# Patient Record
Sex: Female | Born: 1990 | Race: Black or African American | Hispanic: No | Smoking: Current every day smoker
Health system: Southern US, Community
[De-identification: ages and names within clinical notes are randomized; demographics above are authoritative.]

---
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
## 2010-02-09 ENCOUNTER — Emergency Department (HOSPITAL_COMMUNITY): Admission: EM | Admit: 2010-02-09 | Discharge: 2010-02-09 | Payer: Self-pay | Admitting: Emergency Medicine

## 2010-02-21 ENCOUNTER — Emergency Department (HOSPITAL_COMMUNITY): Admission: EM | Admit: 2010-02-21 | Discharge: 2010-02-21 | Payer: Self-pay | Admitting: Emergency Medicine

## 2010-02-22 ENCOUNTER — Emergency Department (HOSPITAL_COMMUNITY): Admission: EM | Admit: 2010-02-22 | Discharge: 2010-02-22 | Payer: Self-pay | Admitting: Emergency Medicine

## 2010-05-20 ENCOUNTER — Emergency Department (HOSPITAL_COMMUNITY): Admission: EM | Admit: 2010-05-20 | Discharge: 2010-05-20 | Payer: Self-pay | Admitting: Family Medicine

## 2011-04-26 LAB — POCT URINALYSIS DIP (DEVICE)
Bilirubin Urine: NEGATIVE
Glucose, UA: NEGATIVE
Hgb urine dipstick: NEGATIVE
Ketones, ur: NEGATIVE
Nitrite: NEGATIVE
Operator id: 270961
Protein, ur: NEGATIVE
Specific Gravity, Urine: 1.02
Urobilinogen, UA: 0.2
pH: 8.5 — ABNORMAL HIGH

## 2011-04-26 LAB — WET PREP, GENITAL
Trich, Wet Prep: NONE SEEN
Yeast Wet Prep HPF POC: NONE SEEN

## 2011-04-26 LAB — GC/CHLAMYDIA PROBE AMP, GENITAL
Chlamydia, DNA Probe: NEGATIVE
GC Probe Amp, Genital: NEGATIVE

## 2011-04-26 LAB — POCT PREGNANCY, URINE
Operator id: 270961
Preg Test, Ur: NEGATIVE

## 2011-04-27 LAB — URINALYSIS, ROUTINE W REFLEX MICROSCOPIC
Bilirubin Urine: NEGATIVE
Glucose, UA: NEGATIVE
Hgb urine dipstick: NEGATIVE
Ketones, ur: NEGATIVE
Nitrite: NEGATIVE
Protein, ur: NEGATIVE
Specific Gravity, Urine: 1.029
Urobilinogen, UA: 1
pH: 7

## 2011-04-27 LAB — WET PREP, GENITAL
Clue Cells Wet Prep HPF POC: NONE SEEN
Trich, Wet Prep: NONE SEEN

## 2011-04-27 LAB — GC/CHLAMYDIA PROBE AMP, GENITAL
Chlamydia, DNA Probe: NEGATIVE
GC Probe Amp, Genital: NEGATIVE

## 2011-04-27 LAB — PREGNANCY, URINE: Preg Test, Ur: NEGATIVE

## 2011-04-27 LAB — URINE MICROSCOPIC-ADD ON

## 2011-05-14 LAB — RPR: RPR Ser Ql: NONREACTIVE

## 2011-05-14 LAB — GC/CHLAMYDIA PROBE AMP, GENITAL
Chlamydia, DNA Probe: NEGATIVE
GC Probe Amp, Genital: NEGATIVE

## 2011-05-22 LAB — RPR: RPR Ser Ql: NONREACTIVE

## 2011-05-22 LAB — WET PREP, GENITAL
Trich, Wet Prep: NONE SEEN
WBC, Wet Prep HPF POC: NONE SEEN

## 2011-05-22 LAB — URINALYSIS, ROUTINE W REFLEX MICROSCOPIC
Bilirubin Urine: NEGATIVE
Glucose, UA: NEGATIVE
Hgb urine dipstick: NEGATIVE
Ketones, ur: NEGATIVE
Nitrite: NEGATIVE
Protein, ur: NEGATIVE
Specific Gravity, Urine: 1.019
Urobilinogen, UA: 1
pH: 7

## 2011-05-22 LAB — POCT PREGNANCY, URINE
Operator id: 25982
Preg Test, Ur: NEGATIVE

## 2011-05-22 LAB — URINE MICROSCOPIC-ADD ON

## 2011-05-22 LAB — GC/CHLAMYDIA PROBE AMP, GENITAL
Chlamydia, DNA Probe: NEGATIVE
GC Probe Amp, Genital: NEGATIVE

## 2011-05-23 LAB — CULTURE, ROUTINE-ABSCESS

## 2011-07-20 ENCOUNTER — Emergency Department (INDEPENDENT_AMBULATORY_CARE_PROVIDER_SITE_OTHER)
Admission: EM | Admit: 2011-07-20 | Discharge: 2011-07-20 | Disposition: A | Discharge status: Home / Self Care | Payer: Self-pay | Source: Home / Self Care | Attending: Emergency Medicine | Admitting: Emergency Medicine

## 2011-07-20 ENCOUNTER — Encounter: Payer: Self-pay | Admitting: Emergency Medicine

## 2011-07-20 DIAGNOSIS — T7840XA Allergy, unspecified, initial encounter: Secondary | ICD-10-CM

## 2011-07-20 MED ORDER — FEXOFENADINE HCL 60 MG PO TABS: 60.0000 mg | ORAL_TABLET | Freq: Two times a day (BID) | ORAL | Status: DC

## 2011-07-20 MED ORDER — HYDROCORTISONE 1 % EX CREA: TOPICAL_CREAM | CUTANEOUS | Status: DC

## 2011-07-20 MED ORDER — PREDNISONE 50 MG PO TABS: ORAL_TABLET | ORAL | Status: AC

## 2011-07-20 NOTE — ED Provider Notes (Signed)
History     CSN: [id number] Arrival date & time: 07/20/2011  2:40 PM   First MD Initiated Contact with Patient 07/20/11 1438      Chief Complaint  Patient presents with  . Urticaria    (Consider location/radiation/quality/duration/timing/severity/associated sxs/prior treatment) HPI Comments: Pt with progressively worsening  itchy "whelps" on RUE starting yesterday. Today woke up with red, itchy, swollen right earlobe, and similar wheal at hairline at occiput. Is itchy all day long. No sensation of being bitten at night, no blood on bedclothes in am. No new lotions, soaps, detergents, medications. No other contacts with similar rash. No pets in the home. No exposure to poison ivy. Tried warm compress which made sx worse. threw mattress and boxspring out this morning as was concerned about bedbugs.   Patient is a 20 y.o. female presenting with urticaria. The history is provided by the patient.  Urticaria This is a new problem. The current episode started 2 days ago. The problem has been gradually worsening. Pertinent negatives include no shortness of breath. She has tried a warm compress for the symptoms. The treatment provided no relief.    History reviewed. No pertinent past medical history.  History reviewed. No pertinent past surgical history.  History reviewed. No pertinent family history.  History  Substance Use Topics  . Smoking status: Current Some Day Smoker  . Smokeless tobacco: Not on file  . Alcohol Use: No    OB History    Grav Para Term Preterm Abortions TAB SAB Ect Mult Living                  Review of Systems  Constitutional: Negative for fever.  HENT: Negative for trouble swallowing and voice change.   Respiratory: Negative for shortness of breath.   Gastrointestinal: Negative for nausea and vomiting.  Skin: Positive for rash.    Allergies  Review of patient's allergies indicates no known allergies.  Home Medications   Current Outpatient Rx    Name Route Sig Dispense Refill  . FEXOFENADINE HCL 60 MG PO TABS Oral Take 1 tablet (60 mg total) by mouth 2 (two) times daily. 20 tablet 0  . HYDROCORTISONE 1 % EX CREA  Apply to affected area 2 times daily 15 g 0  . PREDNISONE 50 MG PO TABS  1 tablet po daily x 2 days, then 1/2 tablet once daily for 2 days 5 tablet 0    BP 108/71  Pulse 88  Temp(Src) 98.4 F (36.9 C) (Oral)  Resp 16  SpO2 100%  LMP 07/16/2011  Physical Exam  Nursing note and vitals reviewed. Constitutional: She is oriented to person, place, and time. She appears well-developed and well-nourished. No distress.  HENT:  Head: Normocephalic and atraumatic.  Eyes: Conjunctivae and EOM are normal. Pupils are equal, round, and reactive to light.  Neck: Normal range of motion.  Cardiovascular: Regular rhythm.   Pulmonary/Chest: Effort normal and breath sounds normal.  Abdominal: She exhibits no distension.  Musculoskeletal: Normal range of motion.  Neurological: She is alert and oriented to person, place, and time.  Skin: Skin is warm and dry.       Urticarial wheal on right elbow with surrounding erythema, excoriations. Swollen right earlobe, urticarial wheal at hairline. No rash anywhere else. No burrows between fingers.   Psychiatric: She has a normal mood and affect. Her behavior is normal. Judgment and thought content normal.    ED Course  Procedures (including critical care time)  Labs Reviewed - No  data to display No results found.   1. Allergic reaction       MDM  Appears to be localised allergic reaction to bug bites. Does not appear to be scabies.   Luiz Blare, MD 07/20/11 1550

## 2011-07-20 NOTE — ED Notes (Signed)
Pt having itchy painful bumps that are showing up starting yesterday. They have gotten worse and more numerous. Has one on Right arm, right ear, and back of neck.

## 2011-10-14 ENCOUNTER — Emergency Department (HOSPITAL_COMMUNITY)
Admission: EM | Admit: 2011-10-14 | Discharge: 2011-10-14 | Disposition: A | Discharge status: Home / Self Care | Payer: Self-pay | Attending: Emergency Medicine | Admitting: Emergency Medicine

## 2011-10-14 ENCOUNTER — Encounter (HOSPITAL_COMMUNITY): Payer: Self-pay | Admitting: *Deleted

## 2011-10-14 DIAGNOSIS — L0231 Cutaneous abscess of buttock: Secondary | ICD-10-CM | POA: Insufficient documentation

## 2011-10-14 DIAGNOSIS — F17200 Nicotine dependence, unspecified, uncomplicated: Secondary | ICD-10-CM | POA: Insufficient documentation

## 2011-10-14 DIAGNOSIS — L03317 Cellulitis of buttock: Secondary | ICD-10-CM | POA: Insufficient documentation

## 2011-10-14 MED ORDER — LIDOCAINE-EPINEPHRINE 2 %-1:100000 IJ SOLN
20.0000 mL | Freq: Once | INTRAMUSCULAR | Status: AC
  Administered 2011-10-14: 2 mL via INTRADERMAL

## 2011-10-14 MED ORDER — HYDROCODONE-ACETAMINOPHEN 5-325 MG PO TABS: 1.0000 | ORAL_TABLET | Freq: Four times a day (QID) | ORAL | Status: AC | PRN

## 2011-10-14 NOTE — ED Notes (Signed)
Pt from home with reports of sore area to top, right buttock, pt sought tx at a STD clinic on Friday but was told that area should drain on its own "but I messed with it" with increase in pain and drainage.

## 2011-10-14 NOTE — Discharge Instructions (Signed)
Return here in 2 days for recheck.  Keep the area covered.  Use heat around the area as well.

## 2011-10-14 NOTE — ED Provider Notes (Signed)
History     CSN: [id number]  Arrival date & time 10/14/11  1202   First MD Initiated Contact with Patient 10/14/11 1243      Chief Complaint  Patient presents with  . Abscess    right upper buttock    (Consider location/radiation/quality/duration/timing/severity/associated sxs/prior treatment) HPI A service emergency department with an abscess to her right upper buttocks that has been present for the last 2 days.  She states that she was at the STD clinic and asked them to evaluated and they said to drain on its own.  Patient denies fevers, shortness breath, weakness, or nausea/vomiting. History reviewed. No pertinent past medical history.  History reviewed. No pertinent past surgical history.  History reviewed. No pertinent family history.  History  Substance Use Topics  . Smoking status: Current Everyday Smoker -- 0.5 packs/day    Types: Cigarettes  . Smokeless tobacco: Never Used  . Alcohol Use: No    OB History    Grav Para Term Preterm Abortions TAB SAB Ect Mult Living                  Review of Systems All pertinent positives and negatives reviewed in the history of present illness  Allergies  Review of patient's allergies indicates no known allergies.  Home Medications   Current Outpatient Rx  Name Route Sig Dispense Refill  . NAPROXEN SODIUM 220 MG PO TABS Oral Take 440 mg by mouth 2 (two) times daily as needed. For pain.      BP 125/79  Pulse 115  Temp(Src) 99 F (37.2 C) (Oral)  Resp 16  Ht 5\' 4"  (1.626 m)  Wt 107 lb (48.535 kg)  BMI 18.37 kg/m2  SpO2 99%  LMP 10/03/2011  Physical Exam  Constitutional: She is oriented to person, place, and time. She appears well-developed and well-nourished. No distress.  Eyes: Pupils are equal, round, and reactive to light.  Cardiovascular: Normal rate, regular rhythm and normal heart sounds.  Exam reveals no gallop and no friction rub.   No murmur heard. Pulmonary/Chest: Effort normal and breath sounds  normal. No respiratory distress.  Neurological: She is alert and oriented to person, place, and time. Coordination normal.  Skin:       Patient has an abscess noted to the right upper buttocks more mediallylocated.    ED Course  Procedures (including critical care time)   INCISION AND DRAINAGE Performed by: Carlyle Dolly Consent: Verbal consent obtained. Risks and benefits: risks, benefits and alternatives were discussed Type: abscess  Body area: R upper buttock medially  Anesthesia: local infiltration  Local anesthetic: lidocaine2%  With Epi  Anesthetic total: 6 ml  Complexity: complex Blunt dissection to break up loculations  Drainage: purulent  Drainage amount: large  Packing material: 1/4 in iodoform gauze  Patient tolerance: Patient tolerated the procedure well with no immediate complications.    Patient is told to return to the emergency department in 2 days for recheck.  She is advised to keep area covered.  She is also asked to return here for any worsening in her condition in the interim.  There is no surrounding cellulitis the area.      MDM         Carlyle Dolly, PA-C 10/14/11 1424  Carlyle Dolly, PA-C 10/14/11 1427

## 2011-10-14 NOTE — ED Provider Notes (Signed)
Medical screening examination/treatment/procedure(s) were performed by non-physician practitioner and as supervising physician I was immediately available for consultation/collaboration.   Glynn Octave, MD 10/14/11 [phone]

## 2011-10-16 ENCOUNTER — Emergency Department (HOSPITAL_COMMUNITY)
Admission: EM | Admit: 2011-10-16 | Discharge: 2011-10-16 | Disposition: A | Discharge status: Home / Self Care | Payer: Self-pay | Attending: Emergency Medicine | Admitting: Emergency Medicine

## 2011-10-16 ENCOUNTER — Encounter (HOSPITAL_COMMUNITY): Payer: Self-pay | Admitting: Emergency Medicine

## 2011-10-16 DIAGNOSIS — Z4800 Encounter for change or removal of nonsurgical wound dressing: Secondary | ICD-10-CM | POA: Insufficient documentation

## 2011-10-16 DIAGNOSIS — L03317 Cellulitis of buttock: Secondary | ICD-10-CM | POA: Insufficient documentation

## 2011-10-16 DIAGNOSIS — L0231 Cutaneous abscess of buttock: Secondary | ICD-10-CM | POA: Insufficient documentation

## 2011-10-16 DIAGNOSIS — Z5189 Encounter for other specified aftercare: Secondary | ICD-10-CM

## 2011-10-16 DIAGNOSIS — F17200 Nicotine dependence, unspecified, uncomplicated: Secondary | ICD-10-CM | POA: Insufficient documentation

## 2011-10-16 NOTE — ED Notes (Signed)
Pt d/c to home by MD

## 2011-10-16 NOTE — ED Provider Notes (Signed)
History     CSN: [id number]  Arrival date & time 10/16/11  4098   First MD Initiated Contact with Patient 10/16/11 [phone]      No chief complaint on file.   (Consider location/radiation/quality/duration/timing/severity/associated sxs/prior treatment) The history is provided by the patient and medical records.   patient reports incision and drainage of her right buttock abscess 2 days ago.  She reports no fever or chills.  She reports it feels much better.  She has no other complaints.  She is here today requesting wound check and packing removal.  History reviewed. No pertinent past medical history.  History reviewed. No pertinent past surgical history.  History reviewed. No pertinent family history.  History  Substance Use Topics  . Smoking status: Current Everyday Smoker -- 0.5 packs/day    Types: Cigarettes  . Smokeless tobacco: Never Used  . Alcohol Use: No    OB History    Grav Para Term Preterm Abortions TAB SAB Ect Mult Living                  Review of Systems  All other systems reviewed and are negative.    Allergies  Review of patient's allergies indicates no known allergies.  Home Medications   Current Outpatient Rx  Name Route Sig Dispense Refill  . HYDROCODONE-ACETAMINOPHEN 5-325 MG PO TABS Oral Take 1 tablet by mouth every 6 (six) hours as needed for pain. 15 tablet 0  . NAPROXEN SODIUM 220 MG PO TABS Oral Take 440 mg by mouth 2 (two) times daily as needed. For pain.      BP 115/70  Pulse 86  Temp(Src) 98.7 F (37.1 C) (Oral)  Resp 18  SpO2 100%  LMP 10/03/2011  Physical Exam  Nursing note and vitals reviewed. Constitutional: She appears well-developed and well-nourished. No distress.  HENT:  Head: Normocephalic and atraumatic.  Eyes: EOM are normal.  Neck: Normal range of motion.  Cardiovascular: Normal rate and regular rhythm.   Pulmonary/Chest: Effort normal.  Abdominal: Soft.  Musculoskeletal: Normal range of motion.       Right  buttock with healing abscess.  Packing in place.  No surrounding erythema  Neurological: She is alert.  Skin: Skin is warm and dry.  Psychiatric: She has a normal mood and affect.    ED Course  Procedures (including critical care time)  Labs Reviewed - No data to display No results found.   1. Wound check, abscess       MDM  Packing removed.  Wound is healing well.  DC home        Lyanne Co, MD 10/16/11 [phone]

## 2011-10-16 NOTE — ED Notes (Signed)
Pt states she is here for a wound check  Pt had an I&D two days ago  Wound is on her buttock and still has packing in place

## 2012-10-12 ENCOUNTER — Encounter (HOSPITAL_COMMUNITY): Payer: Self-pay | Admitting: Family Medicine

## 2012-10-12 ENCOUNTER — Emergency Department (HOSPITAL_COMMUNITY)
Admission: EM | Admit: 2012-10-12 | Discharge: 2012-10-12 | Disposition: A | Discharge status: Home / Self Care | Payer: No Typology Code available for payment source | Attending: Emergency Medicine | Admitting: Emergency Medicine

## 2012-10-12 DIAGNOSIS — S0990XA Unspecified injury of head, initial encounter: Secondary | ICD-10-CM | POA: Insufficient documentation

## 2012-10-12 DIAGNOSIS — Y92410 Unspecified street and highway as the place of occurrence of the external cause: Secondary | ICD-10-CM | POA: Insufficient documentation

## 2012-10-12 DIAGNOSIS — Y9389 Activity, other specified: Secondary | ICD-10-CM | POA: Insufficient documentation

## 2012-10-12 DIAGNOSIS — F17200 Nicotine dependence, unspecified, uncomplicated: Secondary | ICD-10-CM | POA: Insufficient documentation

## 2012-10-12 DIAGNOSIS — S0993XA Unspecified injury of face, initial encounter: Secondary | ICD-10-CM | POA: Insufficient documentation

## 2012-10-12 MED ORDER — METHOCARBAMOL 500 MG PO TABS: 500.0000 mg | ORAL_TABLET | Freq: Two times a day (BID) | ORAL | Status: DC
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
## 2012-10-12 NOTE — ED Provider Notes (Signed)
Medical screening examination/treatment/procedure(s) were performed by non-physician practitioner and as supervising physician I was immediately available for consultation/collaboration.   Michael Y. Ghim, MD 10/12/12 1503 
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
## 2015-02-27 ENCOUNTER — Emergency Department (HOSPITAL_COMMUNITY)
Admission: EM | Admit: 2015-02-27 | Discharge: 2015-02-28 | Disposition: A | Discharge status: Home / Self Care | Payer: Self-pay | Attending: Emergency Medicine | Admitting: Emergency Medicine

## 2015-02-27 ENCOUNTER — Encounter (HOSPITAL_COMMUNITY): Payer: Self-pay

## 2015-02-27 DIAGNOSIS — L720 Epidermal cyst: Secondary | ICD-10-CM | POA: Insufficient documentation

## 2015-02-27 DIAGNOSIS — Z79899 Other long term (current) drug therapy: Secondary | ICD-10-CM | POA: Insufficient documentation

## 2015-02-27 DIAGNOSIS — Z720 Tobacco use: Secondary | ICD-10-CM | POA: Insufficient documentation

## 2015-02-27 DIAGNOSIS — Z880 Allergy status to penicillin: Secondary | ICD-10-CM | POA: Insufficient documentation

## 2015-02-27 DIAGNOSIS — L729 Follicular cyst of the skin and subcutaneous tissue, unspecified: Secondary | ICD-10-CM

## 2015-02-27 NOTE — ED Notes (Signed)
Pt presents with c/o knot on her left knee area. She reports that this knot has been there for years and that it is painful at times.

## 2015-02-28 NOTE — ED Notes (Signed)
Pt is sitting in chair barely able to keep her eyes open, speech is slightly slurred.  Pt reports that the knot on her knee has been there for over a year however began to hurt tonight.  Pt is laughing, talking to her family member and appears to be in NAD.

## 2015-02-28 NOTE — ED Notes (Signed)
Pt left prior to receiving d/c papers, e-signing or allowing VS to be taken.

## 2015-02-28 NOTE — Discharge Instructions (Signed)
Excision of Skin Lesions  Excision of a skin lesion refers to the removal of a section of skin by making small cuts (incisions) in the skin. This is typically done to remove a cancerous growth (basal cell carcinoma, squamous cell carcinoma, or melanoma) or a noncancerous growth (cyst). It may be done to treat or prevent cancer or infection. It may also be done to improve cosmetic appearance (removal of mole, skin tag).  LET YOUR CAREGIVER KNOW ABOUT:   · Allergies to food or medicine.  · Medicines taken, including vitamins, herbs, eyedrops, over-the-counter medicines, and creams.  · Use of steroids (by mouth or creams).  · Previous problems with anesthetics or numbing medicines.  · History of bleeding problems or blood clots.  · History of any prostheses.  · Previous surgery.  · Other health problems, including diabetes and kidney problems.  · Possibility of pregnancy, if this applies.  RISKS AND COMPLICATIONS   Many complications can be managed. With appropriate treatment and rehabilitation, the following complications are very uncommon:  · Bleeding.  · Infection.  · Scarring.  · Recurrence of cyst or cancer.  · Changes in skin sensation or appearance (discoloration, swelling).  · Reaction to anesthesia.  · Allergic reaction to surgical materials or ointments.  · Damage to nerves, blood vessels, muscles, or other structures.  · Continued pain.  BEFORE THE PROCEDURE   It is important to follow your caregiver's instructions prior to your procedure to avoid complications. Steps before your procedure may include:  · Physical exam, blood tests, other procedures, such as removing a small sample for examination under a microscope (biopsy).  · Your caregiver may review the procedure, the anesthesia being used, and what to expect after the procedure with you.  You may be asked to:  · Stop taking certain medicines, such as blood thinners (including aspirin, clopidogrel, ibuprofen), for several days prior to your  procedure.  · Take certain medicines.  · Stop smoking.  It is a good idea to arrange for a ride home after surgery and to have someone to help you with activities during recovery.  PROCEDURE   There are several excision techniques. The type of excision or surgical technique used will depend on your condition, the location of the lesion, and your overall health. After the lesion is sterilized and a local anesthetic is applied, the following may be performed:  Complete surgical excision  The area to be removed is marked with a pen. Using a small scalpel and scissors, the surgeon gently cuts around and under the lesion until it is completely removed. The lesion is placed in a special fluid and sent to the lab for examination. If necessary, bleeding will be controlled with a device that delivers heat. The edges of the wound are stitched together and a dressing is applied. This procedure may be performed to treat a cancerous growth or noncancerous cyst or lesion. Surgeons commonly perform an elliptical excision, to minimize scarring.  Excision of a cyst  The surgeon makes an incision on the cyst. The entire cyst is removed through the incision. The wound may be closed with a suture (stitch).  Shave excision  During shave excision, the surgeon uses a small blade or loop instrument to shave off the lesion. This may be done to remove a mole or skin tag. The wound is usually left to heal on its own without stitches.  Punch excision  During punch excision, the surgeon uses a small, round tool (like a cookie   cutter) to cut a circle shape out of the skin. The outer edges of the skin are stitched together. This may be done to remove a mole or scar or to perform a biopsy of the lesion.  Mohs micrographic surgery  During Mohs micrographic surgery, layers of the lesion are removed with a scalpel or loop instrument and immediately examined under a microscope until all of the abnormal or cancerous tissue is removed. This procedure is  minimally invasive and ensures the best cosmetic outcome, with removal of as little normal tissue as possible. Mohs is usually done to treat skin cancer, such as basal cell carcinoma or squamous cell carcinoma, particularly on the face and ears.  Antibiotic ointment is applied to the surgical area after each of the procedures listed above, as necessary.  AFTER THE PROCEDURE   How well you heal depends on many factors. Most patients heal quite well with proper techniques and self-care. Scarring will lessen over time.  HOME CARE INSTRUCTIONS   · Take medicines for pain as directed.  · Keep the incision area clean, dry, and protected for at least 48 hours. Change dressings as directed.  · For bleeding, apply gentle but firm pressure to the wound using a folded towel for 20 minutes. Call your caregiver if bleeding does not stop.  · Avoid high-impact exercise and activities until the stitches are removed or the area heals.  · Follow your caregiver's instructions to minimize scarring. Avoid sun exposure until the area has healed. Scarring should lessen over time.  · Follow up with your caregiver as directed. Removal of stitches within 4 to 14 days may be necessary.  Finding out the results of your test  Not all test results are available during your visit. If your test results are not back during the visit, make an appointment with your caregiver to find out the results. Do not assume everything is normal if you have not heard from your caregiver or the medical facility. It is important for you to follow up on all of your test results.  SEEK MEDICAL CARE IF:   · You or your child has an oral temperature above 102° F (38.9° C).  · You develop signs of infection (chills, feeling unwell).  · You notice bleeding, pain, discharge, redness, or swelling at the incision site.  · You notice skin irregularities or changes in sensation.  MAKE SURE YOU:   · Understand these instructions.  · Will watch your condition.  · Will get help  right away if you are not doing well or get worse.  FOR MORE INFORMATION   American Academy [employer]: www.aafp.org  American Academy of Dermat[employer]: www.aad.org  Document Released: 10/17/2009 Document Revised: 10/15/2011 Document Reviewed: 10/17/2009  [employer]® Patient Information ©2015 [employer]. This information is not intended to replace advice given to you by your health care provider. Make sure you discuss any questions you have with your health care provider.

## 2015-02-28 NOTE — ED Provider Notes (Signed)
CSN: [id number]     Arrival date & time 02/27/15  2338 History   First MD Initiated Contact with Patient 02/28/15 0011     Chief Complaint  Patient presents with  . Knot on leg      (Consider location/radiation/quality/duration/timing/severity/associated sxs/prior Treatment) HPI Comments: Growth on anterior left knee for the past "years". She states she started feeling pain in the knee tonight prompting ED visit. No fever, injury or trauma to the growth or knee.   The history is provided by the patient. No language interpreter was used.    History reviewed. No pertinent past medical history. History reviewed. No pertinent past surgical history. No family history on file. History  Substance Use Topics  . Smoking status: Current Every Day Smoker -- 0.50 packs/day    Types: Cigarettes  . Smokeless tobacco: Never Used  . Alcohol Use: No   OB History    No data available     Review of Systems  Constitutional: Negative for fever.  Musculoskeletal: Negative.   Skin:       See HPI.      Allergies  Penicillins  Home Medications   Prior to Admission medications   Medication Sig Start Date End Date Taking? Authorizing Provider  methocarbamol (ROBAXIN) 500 MG tablet Take 1 tablet (500 mg total) by mouth 2 (two) times daily. 10/12/12   Fayrene Helper, PA-C  naproxen sodium (ANAPROX) 220 MG tablet Take 440 mg by mouth 2 (two) times daily as needed. For pain.    Historical Provider, MD   BP 129/77 mmHg  Pulse 81  Temp(Src) 97.6 F (36.4 C) (Oral)  Resp 16  SpO2 100%  LMP 02/20/2015 (Approximate) Physical Exam  Constitutional: She is oriented to person, place, and time. She appears well-developed and well-nourished.  Neck: Normal range of motion.  Pulmonary/Chest: Effort normal.  Neurological: She is alert and oriented to person, place, and time.  Skin: Skin is warm and dry.  1 cm diameter, soft firm growth anterior left knee. No apparent tenderness. FROM joint.     ED  Course  Procedures (including critical care time) Labs Review Labs Reviewed - No data to display  Imaging Review No results found.   EKG Interpretation None      MDM   Final diagnoses:  None    1. Cyst, left knee  Will refer to surgery for cyst removal prn.    Elpidio Anis, PA-C 02/28/15 4098  April Palumbo, MD 02/28/15 [id number]

## 2015-04-23 ENCOUNTER — Emergency Department (HOSPITAL_COMMUNITY)
Admission: EM | Admit: 2015-04-23 | Discharge: 2015-04-23 | Disposition: A | Discharge status: Home / Self Care | Payer: No Typology Code available for payment source | Attending: Emergency Medicine | Admitting: Emergency Medicine

## 2015-04-23 ENCOUNTER — Emergency Department (HOSPITAL_COMMUNITY): Payer: No Typology Code available for payment source

## 2015-04-23 ENCOUNTER — Encounter (HOSPITAL_COMMUNITY): Payer: Self-pay

## 2015-04-23 ENCOUNTER — Other Ambulatory Visit: Payer: Self-pay

## 2015-04-23 DIAGNOSIS — S161XXA Strain of muscle, fascia and tendon at neck level, initial encounter: Secondary | ICD-10-CM | POA: Insufficient documentation

## 2015-04-23 DIAGNOSIS — Z720 Tobacco use: Secondary | ICD-10-CM | POA: Diagnosis not present

## 2015-04-23 DIAGNOSIS — Y92410 Unspecified street and highway as the place of occurrence of the external cause: Secondary | ICD-10-CM | POA: Insufficient documentation

## 2015-04-23 DIAGNOSIS — Z880 Allergy status to penicillin: Secondary | ICD-10-CM | POA: Insufficient documentation

## 2015-04-23 DIAGNOSIS — S301XXA Contusion of abdominal wall, initial encounter: Secondary | ICD-10-CM | POA: Diagnosis not present

## 2015-04-23 DIAGNOSIS — Y9389 Activity, other specified: Secondary | ICD-10-CM | POA: Diagnosis not present

## 2015-04-23 DIAGNOSIS — S20219A Contusion of unspecified front wall of thorax, initial encounter: Secondary | ICD-10-CM | POA: Diagnosis not present

## 2015-04-23 DIAGNOSIS — S0990XA Unspecified injury of head, initial encounter: Secondary | ICD-10-CM | POA: Diagnosis not present

## 2015-04-23 DIAGNOSIS — Z3202 Encounter for pregnancy test, result negative: Secondary | ICD-10-CM | POA: Insufficient documentation

## 2015-04-23 DIAGNOSIS — Y998 Other external cause status: Secondary | ICD-10-CM | POA: Diagnosis not present

## 2015-04-23 LAB — ETHANOL: ALCOHOL ETHYL (B): 44 mg/dL — AB (ref <5)

## 2015-04-23 LAB — CBC WITH DIFFERENTIAL/PLATELET
BASOS ABS: 0 10*3/uL (ref 0.0–0.1)
BASOS PCT: 0 %
EOS PCT: 0 %
Eosinophils Absolute: 0 10*3/uL (ref 0.0–0.7)
HCT: 39.1 % (ref 36.0–46.0)
Hemoglobin: 13.7 g/dL (ref 12.0–15.0)
LYMPHS PCT: 13 %
Lymphs Abs: 2 10*3/uL (ref 0.7–4.0)
MCH: 31.9 pg (ref 26.0–34.0)
MCHC: 35 g/dL (ref 30.0–36.0)
MCV: 90.9 fL (ref 78.0–100.0)
MONO ABS: 1.1 10*3/uL — AB (ref 0.1–1.0)
Monocytes Relative: 8 %
Neutro Abs: 11.6 10*3/uL — ABNORMAL HIGH (ref 1.7–7.7)
Neutrophils Relative %: 79 %
PLATELETS: 344 10*3/uL (ref 150–400)
RBC: 4.3 MIL/uL (ref 3.87–5.11)
RDW: 12.8 % (ref 11.5–15.5)
WBC: 14.8 10*3/uL — ABNORMAL HIGH (ref 4.0–10.5)

## 2015-04-23 LAB — BASIC METABOLIC PANEL
Anion gap: 12 (ref 5–15)
BUN: 5 mg/dL — AB (ref 6–20)
CALCIUM: 9.7 mg/dL (ref 8.9–10.3)
CO2: 20 mmol/L — ABNORMAL LOW (ref 22–32)
CREATININE: 0.87 mg/dL (ref 0.44–1.00)
Chloride: 105 mmol/L (ref 101–111)
GFR calc Af Amer: >60 mL/min (ref >60)
GLUCOSE: 104 mg/dL — AB (ref 65–99)
Potassium: 3.5 mmol/L (ref 3.5–5.1)
Sodium: 137 mmol/L (ref 135–145)

## 2015-04-23 LAB — I-STAT BETA HCG BLOOD, ED (MC, WL, AP ONLY): I-stat hCG, quantitative: <5 m[IU]/mL (ref <5)

## 2015-04-23 MED ORDER — IOHEXOL 300 MG/ML  SOLN
100.0000 mL | Freq: Once | INTRAMUSCULAR | Status: AC | PRN
  Administered 2015-04-23: 100 mL via INTRAVENOUS

## 2015-04-23 MED ORDER — TRAMADOL HCL 50 MG PO TABS: 50.0000 mg | ORAL_TABLET | Freq: Four times a day (QID) | ORAL | Status: DC | PRN

## 2015-04-23 NOTE — ED Notes (Signed)
Patient transported to CT 

## 2015-04-23 NOTE — ED Notes (Signed)
[employer] at bedside 
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
## 2015-04-23 NOTE — ED Notes (Addendum)
Upon discharging patient and providing discharge instructions, pt requested pain medications and began cussing.  Saying she is "going to sue this goddamn hospital"  When this nurse told the patient that he would go ask the provider the patient said "forget it, fuck it," her and her family began to cuss at this nurse and try to take my picture and write down this nurse's name.  Patient also began calling this nurse racist slurs "border hopper, phillipino ass, dirty water drinker."    Patient and family were informed that they are not allowed to take photos and police/security would be called if they did.  Patient was belligerent and so was her family.  Patient was in the process of leaving so it was not necessary to have security ask the patient to leave.    Patient did receive discharge instructions regarding prescriptions and return precautions prior to this dialogue and acknowledged her understanding.  This nurse did not respond to her taunts or belligerence and offered her a wheelchair and escorted her to the car waiting area where she departed from the premises without further incident.
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
## 2015-04-23 NOTE — ED Notes (Signed)
Pt logrolled and cleared of backboard by Dr. Judd Lien.

## 2015-04-27 ENCOUNTER — Encounter (HOSPITAL_COMMUNITY): Payer: Self-pay

## 2016-06-28 ENCOUNTER — Emergency Department (HOSPITAL_COMMUNITY)
Admission: EM | Admit: 2016-06-28 | Discharge: 2016-06-28 | Disposition: A | Discharge status: Home / Self Care | Payer: Self-pay | Attending: Emergency Medicine | Admitting: Emergency Medicine

## 2016-06-28 ENCOUNTER — Encounter (HOSPITAL_COMMUNITY): Payer: Self-pay | Admitting: *Deleted

## 2016-06-28 DIAGNOSIS — Z23 Encounter for immunization: Secondary | ICD-10-CM | POA: Insufficient documentation

## 2016-06-28 DIAGNOSIS — X58XXXA Exposure to other specified factors, initial encounter: Secondary | ICD-10-CM | POA: Insufficient documentation

## 2016-06-28 DIAGNOSIS — F17210 Nicotine dependence, cigarettes, uncomplicated: Secondary | ICD-10-CM | POA: Insufficient documentation

## 2016-06-28 DIAGNOSIS — Y929 Unspecified place or not applicable: Secondary | ICD-10-CM | POA: Insufficient documentation

## 2016-06-28 DIAGNOSIS — S61411A Laceration without foreign body of right hand, initial encounter: Secondary | ICD-10-CM | POA: Insufficient documentation

## 2016-06-28 DIAGNOSIS — Y9389 Activity, other specified: Secondary | ICD-10-CM | POA: Insufficient documentation

## 2016-06-28 DIAGNOSIS — Y999 Unspecified external cause status: Secondary | ICD-10-CM | POA: Insufficient documentation

## 2016-06-28 MED ORDER — LIDOCAINE HCL (PF) 1 % IJ SOLN
INTRAMUSCULAR | Status: AC
  Administered 2016-06-28: 5 mL
  Filled 2016-06-28: qty 5

## 2016-06-28 MED ORDER — LIDOCAINE HCL (PF) 1 % IJ SOLN
5.0000 mL | Freq: Once | INTRAMUSCULAR | Status: DC
  Filled 2016-06-28: qty 5

## 2016-06-28 MED ORDER — LIDOCAINE-EPINEPHRINE-TETRACAINE (LET) SOLUTION
3.0000 mL | Freq: Once | NASAL | Status: AC
  Administered 2016-06-28: 3 mL via TOPICAL
  Filled 2016-06-28: qty 3

## 2016-06-28 MED ORDER — TETANUS-DIPHTH-ACELL PERTUSSIS 5-2.5-18.5 LF-MCG/0.5 IM SUSP
0.5000 mL | Freq: Once | INTRAMUSCULAR | Status: AC
  Administered 2016-06-28: 0.5 mL via INTRAMUSCULAR
  Filled 2016-06-28: qty 0.5

## 2016-06-28 MED ORDER — LIDOCAINE-EPINEPHRINE 1 %-1:100000 IJ SOLN: 10.0000 mL | Freq: Once | INTRAMUSCULAR | Status: DC
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
## 2016-06-28 NOTE — Discharge Instructions (Signed)
Keep sutures clean and dry. Follow-up [facility] for suture removal in 1 week. Return to the ED for new or worsening symptoms.

## 2016-06-28 NOTE — ED Provider Notes (Signed)
[facility] Provider Note   CSN: [id number] Arrival date & time: 06/28/16  0347     History   Chief Complaint Chief Complaint  Patient presents with  . Laceration    HPI Minna Merrittsanesha E Follette is a 25 y.o. female.  The history is provided by the patient and medical records.  Laceration       [age] year old female here with laceration of right hand. She reports this occurred yesterday evening around 8 PM while washing dishes. Patient states she has kept it bandaged, however it has intermittently been bleeding.  She is not currently on any blood thinners. Date of last tetanus unknown.  History reviewed. No pertinent past medical history.  There are no active problems to display for this patient.   History reviewed. No pertinent surgical history.  OB History    Gravida Para Term Preterm AB Living   0 0 0 0 0     SAB TAB Ectopic Multiple Live Births   0 0 0           Home Medications    Prior to Admission medications   Medication Sig Start Date End Date Taking? Authorizing Provider  methocarbamol (ROBAXIN) 500 MG tablet Take 1 tablet (500 mg total) by mouth 2 (two) times daily. 10/12/12   Fayrene HelperBowie Tran, PA-C  naproxen sodium (ANAPROX) 220 MG tablet Take 440 mg by mouth 2 (two) times daily as needed. For pain.    Historical Provider, MD  traMADol (ULTRAM) 50 MG tablet Take 1 tablet (50 mg total) by mouth every 6 (six) hours as needed. 04/23/15   Geoffery Lyonsouglas Delo, MD    Family History No family history on file.  Social History Social History  Substance Use Topics  . Smoking status: Current Every Day Smoker    Packs/day: 0.50    Types: Cigarettes  . Smokeless tobacco: Never Used  . Alcohol use Yes     Allergies   Penicillins   Review of Systems Review of Systems  Skin: Positive for wound.  All other systems reviewed and are negative.    Physical Exam Updated Vital Signs BP 119/97 (BP Location: Left Arm)   Pulse 94   Temp 97.9 F (36.6 C) (Oral)   Resp  16   Ht 5\' 5"  (1.651 m)   Wt 46.7 kg   LMP 06/24/2016   SpO2 98%   BMI 17.14 kg/m   Physical Exam  Constitutional: She is oriented to person, place, and time. She appears well-developed and well-nourished.  HENT:  Head: Normocephalic and atraumatic.  Mouth/Throat: Oropharynx is clear and moist.  Eyes: Conjunctivae and EOM are normal. Pupils are equal, round, and reactive to light.  Neck: Normal range of motion.  Cardiovascular: Normal rate, regular rhythm and normal heart sounds.   Pulmonary/Chest: Effort normal and breath sounds normal.  Abdominal: Soft. Bowel sounds are normal.  Musculoskeletal: Normal range of motion.  3cm laceration to ulnar aspect of right dorsal hand, no active bleeding, wound is gaping, full ROM of all fingers including 5th digit; no deep tissue, vessel, or tendon involvement; strong radial pulse and cap refill; normal sensation throughout  Neurological: She is alert and oriented to person, place, and time.  Skin: Skin is warm and dry.  Psychiatric: She has a normal mood and affect.  Nursing note and vitals reviewed.    ED Treatments / Results  Labs (all labs ordered are listed, but only abnormal results are displayed) Labs Reviewed - No data to display  EKG  EKG Interpretation None       Radiology No results found.  Procedures Procedures (including critical care time)  LACERATION REPAIR Performed by: Garlon HatchetSANDERS, Ahley Bulls M Authorized by: Garlon HatchetSANDERS, Lynwood Kubisiak M Consent: Verbal consent obtained. Risks and benefits: risks, benefits and alternatives were discussed Consent given by: patient Patient identity confirmed: provided demographic data Prepped and Draped in normal sterile fashion Wound explored  Laceration Location: right dorsal hand, ulnar edge2  Laceration Length: 3cm  No Foreign Bodies seen or palpated  Anesthesia: local infiltration  Local anesthetic: lidocaine 1% without epinephrine  Anesthetic total: 4 ml  Irrigation method:  syringe Amount of cleaning: standard  Skin closure: 4-0 prolene  Number of sutures: 3  Technique: simple interrupted  Patient tolerance: Patient tolerated the procedure well with no immediate complications.   Medications Ordered in ED Medications  lidocaine (PF) (XYLOCAINE) 1 % injection 5 mL (not administered)  lidocaine (PF) (XYLOCAINE) 1 % injection (not administered)  Tdap (BOOSTRIX) injection 0.5 mL (0.5 mLs Intramuscular Given 06/28/16 0800)  lidocaine-EPINEPHrine-tetracaine (LET) solution (3 mLs Topical Given 06/28/16 0757)     Initial Impression / Assessment and Plan / ED Course  I have reviewed the triage vital signs and the nursing notes.  Pertinent labs & imaging results that were available during my care of the patient were reviewed by me and considered in my medical decision making (see chart for details).  Clinical Course    25 year old female here with laceration of right hand that occurred last night while washing dishes. 3 cm in her wound to dorsal right hand along the ulnar aspect. Wound is gaping but not actively bleeding. There is no evidence of deep tissue, vessel, or tendon involvement. She has full range of motion of all of her fingers. Her hand is neurovascularly intact. Tetanus updated here. Laceration repaired as above, patient tolerated well. Discussed home wound care. Follow-up with urgent care for suture removal in one week. Discussed plan with patient, she acknowledged understanding and agreed with plan of care.  Return precautions given for new or worsening symptoms.  Final Clinical Impressions(s) / ED Diagnoses   Final diagnoses:  Laceration of right hand, foreign body presence unspecified, initial encounter    New Prescriptions New Prescriptions   No medications on file     Garlon HatchetLisa M Madelline Eshbach, PA-C 06/28/16 [id number]    Tomasita CrumbleAdeleke Oni, MD 06/28/16 1341

## 2016-07-05 ENCOUNTER — Encounter (HOSPITAL_COMMUNITY): Payer: Self-pay | Admitting: Family Medicine

## 2016-07-05 ENCOUNTER — Ambulatory Visit (HOSPITAL_COMMUNITY)
Admission: EM | Admit: 2016-07-05 | Discharge: 2016-07-05 | Discharge status: Against Medical Advice | Payer: Self-pay | Attending: Family Medicine | Admitting: Family Medicine

## 2016-07-05 NOTE — ED Notes (Signed)
Patient was called to the room. Asked if her sister and 2 juvenile children (of her sister) could come back with her. She was advised that her sister could accompany her but not her children. She advised that she would just leave. Patient became belligerent and threatening to staff.

## 2016-07-11 ENCOUNTER — Ambulatory Visit (HOSPITAL_COMMUNITY): Admission: EM | Admit: 2016-07-11 | Discharge: 2016-07-11 | Discharge status: Against Medical Advice | Payer: Self-pay

## 2016-07-11 ENCOUNTER — Encounter (HOSPITAL_COMMUNITY): Payer: Self-pay | Admitting: *Deleted

## 2016-07-11 ENCOUNTER — Ambulatory Visit (HOSPITAL_COMMUNITY)
Admission: EM | Admit: 2016-07-11 | Discharge: 2016-07-11 | Disposition: A | Discharge status: Home / Self Care | Payer: Self-pay | Attending: Family Medicine | Admitting: Family Medicine

## 2016-07-11 DIAGNOSIS — Z4802 Encounter for removal of sutures: Secondary | ICD-10-CM

## 2016-07-11 MED ORDER — DOXYCYCLINE HYCLATE 100 MG PO CAPS: 100.0000 mg | ORAL_CAPSULE | Freq: Two times a day (BID) | ORAL | 0 refills | Status: AC

## 2016-07-11 NOTE — ED Provider Notes (Signed)
[facility]    CSN: [id number] Arrival date & time: 07/11/16  1832     History   Chief Complaint Chief Complaint  Patient presents with  . Suture / Staple Removal    HPI Minna Merrittsanesha E Keelan is a 25 y.o. female.   This is a 25 year old woman who comes in for follow-up on a laceration that was repaired On 06/27/2016, 13 days ago, in the emergency department. She didn't washing dishes when one of them broke and lacerated her right hand.  She's had some swelling and a little bit of purulent discharge over the last week. This is her dominant hand. She does not work. She is able to move her fingers normally and carry out all functions of daily living.      History reviewed. No pertinent past medical history.  There are no active problems to display for this patient.   History reviewed. No pertinent surgical history.  OB History    Gravida Para Term Preterm AB Living   0 0 0 0 0     SAB TAB Ectopic Multiple Live Births   0 0 0           Home Medications    Prior to Admission medications   Medication Sig Start Date End Date Taking? Authorizing Provider  doxycycline (VIBRAMYCIN) 100 MG capsule Take 1 capsule (100 mg total) by mouth 2 (two) times daily. 07/11/16   Elvina SidleKurt Treshaun Carrico, MD    Family History History reviewed. No pertinent family history.  Social History Social History  Substance Use Topics  . Smoking status: Current Every Day Smoker    Packs/day: 0.50    Types: Cigarettes  . Smokeless tobacco: Never Used  . Alcohol use Yes     Allergies   Penicillins   Review of Systems Review of Systems  Constitutional: Negative.   Musculoskeletal: Negative for arthralgias.  Skin: Positive for wound.     Physical Exam Triage Vital Signs ED Triage Vitals [07/11/16 1910]  Enc Vitals Group     BP      Pulse      Resp      Temp      Temp src      SpO2      Weight      Height      Head Circumference      Peak Flow      Pain Score 5   Pain Loc      Pain Edu?      Excl. in GC?    No data found.   Updated Vital Signs BP 140/86 (BP Location: Right Arm)   Pulse 78   Temp 98.6 F (37 C) (Oral)   Resp 18   LMP 06/24/2016   SpO2 99%   Physical Exam  Constitutional: She appears well-developed and well-nourished.  HENT:  Head: Normocephalic.  Right Ear: External ear normal.  Left Ear: External ear normal.  Mouth/Throat: Oropharynx is clear and moist.  Eyes: Conjunctivae and EOM are normal.  Neck: Normal range of motion. Neck supple.  Pulmonary/Chest: Effort normal.  Skin: Skin is warm and dry.  The side of the lacerations over the MCP joint, dorsally, of the right fifth metacarpal. 3 sutures were removed without difficulty.  The area of the laceration is hyperpigmented along a 3 cm stretch. There is some mild induration but there is no purulent drainage.  Psychiatric: She has a normal mood and affect. Her behavior is normal.  Nursing note and  vitals reviewed.    UC Treatments / Results  Labs (all labs ordered are listed, but only abnormal results are displayed) Labs Reviewed - No data to display  EKG  EKG Interpretation None       Radiology No results found.  Procedures Procedures (including critical care time)  Medications Ordered in UC Medications - No data to display   Initial Impression / Assessment and Plan / UC Course  I have reviewed the triage vital signs and the nursing notes.  Pertinent labs & imaging results that were available during my care of the patient were reviewed by me and considered in my medical decision making (see chart for details).  Clinical Course     Final Clinical Impressions(s) / UC Diagnoses   Final diagnoses:  Visit for suture removal    New Prescriptions New Prescriptions   DOXYCYCLINE (VIBRAMYCIN) 100 MG CAPSULE    Take 1 capsule (100 mg total) by mouth 2 (two) times daily.     Elvina SidleKurt Raymir Frommelt, MD 07/11/16 1919

## 2016-07-11 NOTE — ED Triage Notes (Signed)
Pt  Here  For  Suture  Removal  Sutures  Have  Been in  X   13  Days     Pt  Pt  Was  Here  6  Days  Ago  But  Did  Not   Stay      For  Treatment

## 2016-07-11 NOTE — Discharge Instructions (Signed)
Expect the swelling to diminish over the next week. It will take a month for the swelling to completely go away.  If you develop drainage, increasing tenderness, or increasing swelling, I want you to return immediately to this facility.

## 2016-07-11 NOTE — ED Notes (Signed)
Called and did not answer

## 2016-11-29 ENCOUNTER — Encounter (HOSPITAL_COMMUNITY): Payer: Self-pay | Admitting: Emergency Medicine

## 2016-11-29 ENCOUNTER — Ambulatory Visit (HOSPITAL_COMMUNITY)
Admission: EM | Admit: 2016-11-29 | Discharge: 2016-11-29 | Disposition: A | Discharge status: Home / Self Care | Payer: Self-pay | Attending: Family Medicine | Admitting: Family Medicine

## 2016-11-29 DIAGNOSIS — S00261A Insect bite (nonvenomous) of right eyelid and periocular area, initial encounter: Secondary | ICD-10-CM

## 2016-11-29 DIAGNOSIS — T7840XA Allergy, unspecified, initial encounter: Secondary | ICD-10-CM

## 2016-11-29 DIAGNOSIS — H578 Other specified disorders of eye and adnexa: Secondary | ICD-10-CM

## 2016-11-29 DIAGNOSIS — Z91038 Other insect allergy status: Secondary | ICD-10-CM

## 2016-11-29 DIAGNOSIS — H02843 Edema of right eye, unspecified eyelid: Secondary | ICD-10-CM

## 2016-11-29 DIAGNOSIS — W57XXXA Bitten or stung by nonvenomous insect and other nonvenomous arthropods, initial encounter: Secondary | ICD-10-CM

## 2016-11-29 DIAGNOSIS — H5789 Other specified disorders of eye and adnexa: Secondary | ICD-10-CM

## 2016-11-29 MED ORDER — FAMOTIDINE 20 MG PO TABS
ORAL_TABLET | ORAL | Status: AC
  Filled 2016-11-29: qty 1

## 2016-11-29 MED ORDER — METHYLPREDNISOLONE SODIUM SUCC 125 MG IJ SOLR
INTRAMUSCULAR | Status: AC
  Filled 2016-11-29: qty 2

## 2016-11-29 MED ORDER — DIPHENHYDRAMINE HCL 50 MG/ML IJ SOLN
25.0000 mg | INTRAMUSCULAR | Status: AC
  Administered 2016-11-29: 25 mg via INTRAVENOUS

## 2016-11-29 MED ORDER — FAMOTIDINE 20 MG PO TABS
20.0000 mg | ORAL_TABLET | Freq: Once | ORAL | Status: AC
  Administered 2016-11-29: 20 mg via ORAL

## 2016-11-29 MED ORDER — METHYLPREDNISOLONE SODIUM SUCC 125 MG IJ SOLR
125.0000 mg | Freq: Once | INTRAMUSCULAR | Status: AC
  Administered 2016-11-29: 125 mg via INTRAMUSCULAR

## 2016-11-29 MED ORDER — FAMOTIDINE IN NACL 20-0.9 MG/50ML-% IV SOLN: 20.0000 mg | Freq: Once | INTRAVENOUS | Status: DC

## 2016-11-29 MED ORDER — DIPHENHYDRAMINE HCL 50 MG/ML IJ SOLN
INTRAMUSCULAR | Status: AC
  Filled 2016-11-29: qty 1

## 2016-11-29 MED ORDER — METHYLPREDNISOLONE 4 MG PO TBPK: ORAL_TABLET | ORAL | 0 refills | Status: AC

## 2016-11-29 MED ORDER — ERYTHROMYCIN 5 MG/GM OP OINT: TOPICAL_OINTMENT | OPHTHALMIC | 0 refills | Status: AC

## 2016-11-29 MED ORDER — HYDROXYZINE HCL 25 MG PO TABS: 25.0000 mg | ORAL_TABLET | Freq: Four times a day (QID) | ORAL | 0 refills | Status: AC

## 2016-11-29 NOTE — ED Triage Notes (Signed)
PT's right eye is red and swollen shut. PT also has large, red, swollen, itchy areas on body. PT reports no new exposure.

## 2016-11-29 NOTE — ED Provider Notes (Signed)
CSN: [id number]     Arrival date & time 11/29/16  1129 History   None    Chief Complaint  Patient presents with  . Eye Problem  . Insect Bite   (Consider location/radiation/quality/duration/timing/severity/associated sxs/prior Treatment) Patient c/o right eye swollen and rash throughout.  She states she has been bitten by a bug and has allergic reaction.   The history is provided by the patient.  Eye Problem  Location:  Right eye Quality:  Aching Severity:  Moderate Onset quality:  Sudden Duration:  1 day Timing:  Constant Progression:  Worsening Chronicity:  New Relieved by:  Nothing Worsened by:  Nothing Associated symptoms: itching     History reviewed. No pertinent past medical history. History reviewed. No pertinent surgical history. No family history on file. Social History  Substance Use Topics  . Smoking status: Current Every Day Smoker    Packs/day: 0.50    Types: Cigarettes  . Smokeless tobacco: Never Used  . Alcohol use Yes     Comment: sometimes   OB History    Gravida Para Term Preterm AB Living   0 0 0 0 0     SAB TAB Ectopic Multiple Live Births   0 0 0         Review of Systems  Constitutional: Negative.   HENT: Negative.   Eyes: Positive for itching.  Respiratory: Negative.   Cardiovascular: Negative.   Gastrointestinal: Negative.   Endocrine: Negative.   Genitourinary: Negative.   Musculoskeletal: Negative.   Skin: Positive for rash.  Allergic/Immunologic: Negative.   Neurological: Negative.   Hematological: Negative.   Psychiatric/Behavioral: Negative.     Allergies  Penicillins  Home Medications   Prior to Admission medications   Medication Sig Start Date End Date Taking? Authorizing Provider  doxycycline (VIBRAMYCIN) 100 MG capsule Take 1 capsule (100 mg total) by mouth 2 (two) times daily. 07/11/16   Elvina Sidle, MD  erythromycin ophthalmic ointment Place a 1/2 inch ribbon of ointment into the lower eyelid. 11/29/16    Deatra Canter, FNP  hydrOXYzine (ATARAX/VISTARIL) 25 MG tablet Take 1 tablet (25 mg total) by mouth every 6 (six) hours. 11/29/16   Deatra Canter, FNP  methylPREDNISolone (MEDROL DOSEPAK) 4 MG TBPK tablet Take 6-5-4-3-2-1 po qd 11/29/16   Deatra Canter, FNP   Meds Ordered and Administered this Visit   Medications  methylPREDNISolone sodium succinate (SOLU-MEDROL) 125 mg/2 mL injection 125 mg (125 mg Intramuscular Given 11/29/16 1234)  diphenhydrAMINE (BENADRYL) injection 25 mg (25 mg Intravenous Given 11/29/16 1233)  famotidine (PEPCID) tablet 20 mg (20 mg Oral Given 11/29/16 1233)    BP (!) 142/80 (BP Location: Right Arm)   Pulse 65   Temp 98.7 F (37.1 C) (Oral)   Resp 16   Ht  (1.626 m)   Wt 103 lb (46.7 kg)   LMP 11/28/2016   SpO2 98%   BMI 17.68 kg/m  No data found.   Physical Exam  Constitutional: She appears well-developed and well-nourished.  HENT:  Head: Normocephalic and atraumatic.  Eyes: EOM are normal. Pupils are equal, round, and reactive to light.  Right upper eye lid and peri orbital area erythematous and swollen.  Able to see eye and conjunctiva of right eye by moving lower eye lid down.  Right upper eye lid is swollen and unable to move or lift.    Neck: Normal range of motion. Neck supple.  Cardiovascular: Normal rate, regular rhythm and normal heart sounds.  Pulmonary/Chest: Effort normal and breath sounds normal.  Abdominal: Soft. Bowel sounds are normal.  Skin: Rash noted.  Erythematous papular itchy rash  Nursing note and vitals reviewed.   Urgent Care Course     Procedures (including critical care time)  Labs Review Labs Reviewed - No data to display  Imaging Review No results found.   Visual Acuity Review  Right Eye Distance:   Left Eye Distance:   Bilateral Distance:    Right Eye Near:   Left Eye Near:    Bilateral Near:         MDM   1. Eye swelling, right   2. Allergic reaction, initial encounter   3. Insect  bite, initial encounter    Solumedrol  IV Benadryl  IM Pepcid  po  Medrol dose pack as directed Hydroxyzine  one po q 6 hours prn itching  Patient feels a lot better and not itching after IV meds given and resting approx 30 minutes  Erythromycin opthalmic ointment apply 1/2 inch lower eyelid    Deatra Canter, FNP 11/29/16 1919
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
